# Patient Record
Sex: Male | Born: 1975
Health system: Southern US, Community
[De-identification: ages and names within clinical notes are randomized; demographics above are authoritative.]

---

## 2008-07-15 ENCOUNTER — Inpatient Hospital Stay (HOSPITAL_COMMUNITY): Admission: AC | Admit: 2008-07-15 | Discharge: 2008-07-22 | Payer: Self-pay
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
## 2010-08-29 LAB — CBC
HCT: 27.1 % — ABNORMAL LOW (ref 39.0–52.0)
HCT: 27.4 % — ABNORMAL LOW (ref 39.0–52.0)
HCT: 29.5 % — ABNORMAL LOW (ref 39.0–52.0)
Hemoglobin: 10.4 g/dL — ABNORMAL LOW (ref 13.0–17.0)
Hemoglobin: 9.6 g/dL — ABNORMAL LOW (ref 13.0–17.0)
Hemoglobin: 9.7 g/dL — ABNORMAL LOW (ref 13.0–17.0)
MCHC: 35.3 g/dL (ref 30.0–36.0)
MCHC: 35.4 g/dL (ref 30.0–36.0)
MCV: 87.1 fL (ref 78.0–100.0)
MCV: 87.5 fL (ref 78.0–100.0)
MCV: 87.6 fL (ref 78.0–100.0)
Platelets: 133 10*3/uL — ABNORMAL LOW (ref 150–400)
RBC: 2.48 MIL/uL — ABNORMAL LOW (ref 4.22–5.81)
RBC: 3.11 MIL/uL — ABNORMAL LOW (ref 4.22–5.81)
RBC: 3.14 MIL/uL — ABNORMAL LOW (ref 4.22–5.81)
RDW: 13.9 % (ref 11.5–15.5)
WBC: 6.4 10*3/uL (ref 4.0–10.5)
WBC: 6.6 10*3/uL (ref 4.0–10.5)

## 2010-08-29 LAB — DIFFERENTIAL
Lymphocytes Relative: 19 % (ref 12–46)
Lymphs Abs: 1.2 10*3/uL (ref 0.7–4.0)
Monocytes Relative: 8 % (ref 3–12)
Neutrophils Relative %: 72 % (ref 43–77)

## 2010-08-29 LAB — PROTIME-INR: INR: 1.2 (ref 0.00–1.49)

## 2010-09-03 LAB — CBC
Hemoglobin: 10.4 g/dL — ABNORMAL LOW (ref 13.0–17.0)
RBC: 3.41 MIL/uL — ABNORMAL LOW (ref 4.22–5.81)
RDW: 13.5 % (ref 11.5–15.5)

## 2010-09-03 LAB — POCT I-STAT, CHEM 8
BUN: 11 mg/dL (ref 6–23)
Chloride: 107 meq/L (ref 96–112)
Chloride: 108 meq/L (ref 96–112)
HCT: 36 % — ABNORMAL LOW (ref 39.0–52.0)
Potassium: 3.7 meq/L (ref 3.5–5.1)
Potassium: 4.9 meq/L (ref 3.5–5.1)
Sodium: 139 meq/L (ref 135–145)
TCO2: 20 mmol/L (ref 0–100)

## 2010-09-03 LAB — TYPE AND SCREEN

## 2010-09-03 LAB — ABO/RH: ABO/RH(D): O POS

## 2010-09-03 LAB — HEMOGLOBIN AND HEMATOCRIT, BLOOD
HCT: 31.5 % — ABNORMAL LOW (ref 39.0–52.0)
Hemoglobin: 11.1 g/dL — ABNORMAL LOW (ref 13.0–17.0)
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
## 2015-10-26 ENCOUNTER — Ambulatory Visit: Payer: Self-pay | Attending: Internal Medicine

## 2016-06-06 ENCOUNTER — Ambulatory Visit: Payer: Self-pay | Admitting: Family Medicine

## 2016-06-12 ENCOUNTER — Ambulatory Visit: Payer: Self-pay | Admitting: Family Medicine

## 2016-06-18 ENCOUNTER — Encounter: Payer: Self-pay | Admitting: Family Medicine

## 2016-06-18 ENCOUNTER — Ambulatory Visit: Payer: Self-pay | Attending: Family Medicine | Admitting: Family Medicine

## 2016-06-18 VITALS — BP 112/77 | HR 88 | Temp 97.4°F | Resp 18 | Ht 76.0 in | Wt 289.0 lb

## 2016-06-18 DIAGNOSIS — H60509 Unspecified acute noninfective otitis externa, unspecified ear: Secondary | ICD-10-CM | POA: Insufficient documentation

## 2016-06-18 DIAGNOSIS — H60503 Unspecified acute noninfective otitis externa, bilateral: Secondary | ICD-10-CM

## 2016-06-18 MED ORDER — CIPROFLOXACIN HCL 0.2 % OT SOLN: 0.2000 mL | Freq: Two times a day (BID) | OTIC | 0 refills | Status: AC

## 2016-06-18 NOTE — Patient Instructions (Signed)
Otitis Externa Otitis externa is a germ infection in the outer ear. The outer ear is the area from the eardrum to the outside of the ear. Otitis externa is sometimes called "swimmer's ear." HOME CARE  Put drops in the ear as told by your doctor.  Only take medicine as told by your doctor.  If you have diabetes, your doctor may give you more directions. Follow your doctor's directions.  Keep all doctor visits as told. To avoid another infection:  Keep your ear dry. Use the corner of a towel to dry your ear after swimming or bathing.  Avoid scratching or putting things inside your ear.  Avoid swimming in lakes, dirty water, or pools that use a chemical called chlorine poorly.  You may use ear drops after swimming. Combine equal amounts of white vinegar and alcohol in a bottle. Put 3 or 4 drops in each ear. GET HELP IF:   You have a fever.  Your ear is still red, puffy (swollen), or painful after 3 days.  You still have yellowish-white fluid (pus) coming from the ear after 3 days.  Your redness, puffiness, or pain gets worse.  You have a really bad headache.  You have redness, puffiness, pain, or tenderness behind your ear. MAKE SURE YOU:   Understand these instructions.  Will watch your condition.  Will get help right away if you are not doing well or get worse. This information is not intended to replace advice given to you by your health care provider. Make sure you discuss any questions you have with your health care provider. Document Released: 10/22/2007 Document Revised: 05/26/2014 Document Reviewed: 02/12/2015 Elsevier Interactive Patient Education  2017 [employer]  

## 2016-06-18 NOTE — Progress Notes (Signed)
Patient is here for ear pain that is a level 6 that comes and go been going on for a month now, itching all day  Patient has eaten today  Patient has not taking any meds today  Patient declined the flu shot today   Patient requested liver, kidney & hepatic function blood work done

## 2016-06-18 NOTE — Progress Notes (Signed)
   Subjective:  Patient ID: [name], male    DOB: 1975-12-10  Age: 41 y.o. MRN: [id number]  CC: Establish Care   HPI [name] presents for 1 month history of severe itching in bilateral ear canals. Denies ear pain, difficulty hearing, drainage, fluid in the ear, or trauma. He reports some tinnitus of the right ear that sounding like ringing.      No outpatient prescriptions prior to visit.   No facility-administered medications prior to visit.     ROS Review of Systems  HENT: Positive for tinnitus (right). Negative for hearing loss.        Ear canals itching  Respiratory: Negative.   Cardiovascular: Negative.    Objective:  BP 112/77 (BP Location: Left Arm, Patient Position: Sitting, Cuff Size: Normal)   Pulse 88   Temp 97.4 F (36.3 C) (Oral)   Resp 18   Ht 6\' 4"  (1.93 m)   Wt 289 lb (131.1 kg)   SpO2 100%   BMI 35.18 kg/m   BP/Weight 06/18/2016  Systolic BP 112  Diastolic BP 77  Wt. (Lbs) 289  BMI 35.18    Physical Exam  HENT:  Right Ear: Hearing and tympanic membrane normal.  Left Ear: Hearing and tympanic membrane normal.  Nose: Nose normal.  Mouth/Throat: Oropharynx is clear and moist.  Inflammation and flaking to the bilateral ear canals.  Cardiovascular: Normal rate, regular rhythm, normal heart sounds and intact distal pulses.   Pulmonary/Chest: Effort normal and breath sounds normal.  Nursing note and vitals reviewed.  Assessment & Plan:   Problem List Items Addressed This Visit    None    Visit Diagnoses    Acute otitis externa of both ears, unspecified type    -  Primary   Relevant Medications   Ciprofloxacin HCl 0.2 % otic solution      Meds ordered this encounter  Medications  . Ciprofloxacin HCl 0.2 % otic solution    Sig: Place 0.2 mLs into both ears 2 (two) times daily.    Dispense:  14 vial    Refill:  0    Order Specific Question:   Supervising Provider    Answer:   Quentin AngstJEGEDE, OLUGBEMIGA E [id number]    Follow-up: Return  if symptoms worsen or fail to improve.   Alec BarkMandesia R Bernarda Erck FNP

## 2016-06-26 MED FILL — CIPROFLOXACIN 0.2% OTIC SOL: 0.2 | 10 days supply | Qty: 1 | Fill #0

## 2016-06-30 ENCOUNTER — Ambulatory Visit: Payer: Self-pay | Attending: Family Medicine
